# Patient Record
Sex: Female | Born: 1980 | Race: Black or African American | Hispanic: No | Marital: Married | Smoking: Current every day smoker
Health system: Southern US, Community
[De-identification: ages and names within clinical notes are randomized; demographics above are authoritative.]

---

## 2017-06-20 ENCOUNTER — Emergency Department
Admission: EM | Admit: 2017-06-20 | Discharge: 2017-06-20 | Disposition: A | Discharge status: Home / Self Care | Payer: Self-pay | Attending: Emergency Medicine | Admitting: Emergency Medicine

## 2017-06-20 ENCOUNTER — Encounter: Payer: Self-pay | Admitting: *Deleted

## 2017-06-20 ENCOUNTER — Emergency Department: Payer: Self-pay

## 2017-06-20 DIAGNOSIS — F17210 Nicotine dependence, cigarettes, uncomplicated: Secondary | ICD-10-CM | POA: Insufficient documentation

## 2017-06-20 DIAGNOSIS — N10 Acute tubulo-interstitial nephritis: Secondary | ICD-10-CM | POA: Insufficient documentation

## 2017-06-20 LAB — BASIC METABOLIC PANEL
ANION GAP: 7 (ref 5–15)
BUN: 10 mg/dL (ref 6–20)
CALCIUM: 9.3 mg/dL (ref 8.9–10.3)
CO2: 25 mmol/L (ref 22–32)
Chloride: 106 mmol/L (ref 101–111)
Creatinine, Ser: 0.74 mg/dL (ref 0.44–1.00)
GFR calc non Af Amer: >60 mL/min (ref >60)
Glucose, Bld: 130 mg/dL — ABNORMAL HIGH (ref 65–99)
Potassium: 3.8 mmol/L (ref 3.5–5.1)
SODIUM: 138 mmol/L (ref 135–145)

## 2017-06-20 LAB — URINALYSIS, COMPLETE (UACMP) WITH MICROSCOPIC
BILIRUBIN URINE: NEGATIVE
GLUCOSE, UA: NEGATIVE mg/dL
HGB URINE DIPSTICK: NEGATIVE
KETONES UR: NEGATIVE mg/dL
Nitrite: NEGATIVE
Protein, ur: NEGATIVE mg/dL
Specific Gravity, Urine: 1.018 (ref 1.005–1.030)
pH: 6 (ref 5.0–8.0)

## 2017-06-20 LAB — CBC
HCT: 36.4 % (ref 35.0–47.0)
HEMOGLOBIN: 12.7 g/dL (ref 12.0–16.0)
MCH: 33.1 pg (ref 26.0–34.0)
MCHC: 34.9 g/dL (ref 32.0–36.0)
MCV: 94.7 fL (ref 80.0–100.0)
PLATELETS: 234 10*3/uL (ref 150–440)
RBC: 3.84 MIL/uL (ref 3.80–5.20)
RDW: 12.5 % (ref 11.5–14.5)
WBC: 7.7 10*3/uL (ref 3.6–11.0)

## 2017-06-20 LAB — PREGNANCY, URINE: Preg Test, Ur: NEGATIVE

## 2017-06-20 MED ORDER — ONDANSETRON 4 MG PO TBDP: 4.0000 mg | ORAL_TABLET | Freq: Three times a day (TID) | ORAL | 0 refills | Status: AC | PRN

## 2017-06-20 MED ORDER — CIPROFLOXACIN HCL 500 MG PO TABS
500.0000 mg | ORAL_TABLET | Freq: Once | ORAL | Status: AC
  Administered 2017-06-20: 500 mg via ORAL
  Filled 2017-06-20: qty 1

## 2017-06-20 MED ORDER — ONDANSETRON HCL 4 MG/2ML IJ SOLN
4.0000 mg | Freq: Once | INTRAMUSCULAR | Status: AC
  Administered 2017-06-20: 4 mg via INTRAVENOUS
  Filled 2017-06-20: qty 2

## 2017-06-20 MED ORDER — KETOROLAC TROMETHAMINE 30 MG/ML IJ SOLN
30.0000 mg | Freq: Once | INTRAMUSCULAR | Status: AC
  Administered 2017-06-20: 30 mg via INTRAVENOUS
  Filled 2017-06-20: qty 1

## 2017-06-20 MED ORDER — CIPROFLOXACIN HCL 500 MG PO TABS: 500.0000 mg | ORAL_TABLET | Freq: Two times a day (BID) | ORAL | 0 refills | Status: AC

## 2017-06-20 MED ORDER — OXYCODONE-ACETAMINOPHEN 5-325 MG PO TABS: 1.0000 | ORAL_TABLET | ORAL | 0 refills | Status: AC | PRN

## 2017-06-20 NOTE — ED Notes (Signed)

## 2017-06-20 NOTE — ED Notes (Signed)
Pt going to CT

## 2017-06-20 NOTE — ED Provider Notes (Signed)
[facility] Emergency Department Provider Note    First MD Initiated Contact with Patient 06/20/17 0230     (approximate)  I have reviewed the triage vital signs and the nursing notes.   HISTORY  Chief Complaint Flank Pain    HPI Lauren Conley is a 36 y.o. female presents to the emergency department with one-week history of right flank pain nausea and dysuria. Patient denies any history of kidney stones. Patient does admit to malodorous urine. Patient denies any fever. Patient denies any vomiting   Past medical history None There are no active problems to display for this patient.   Past surgical history None  Prior to Admission medications   Medication Sig Start Date End Date Taking? Authorizing Provider  ciprofloxacin (CIPRO) 500 MG tablet Take 1 tablet (500 mg total) by mouth 2 (two) times daily. 06/20/17 06/30/17  Darci Current, MD  ondansetron (ZOFRAN ODT) 4 MG disintegrating tablet Take 1 tablet (4 mg total) by mouth every 8 (eight) hours as needed for nausea or vomiting. 06/20/17   Darci Current, MD  oxyCODONE-acetaminophen (ROXICET) 5-325 MG tablet Take 1 tablet by mouth every 4 (four) hours as needed for severe pain. 06/20/17   Darci Current, MD    Allergies No known drug allergies  No family history on file.  Social History Social History  Substance Use Topics  . Smoking status: Current Every Day Smoker  . Smokeless tobacco: Never Used  . Alcohol use No    Review of Systems Constitutional: No fever/chills Eyes: No visual changes. ENT: No sore throat. Cardiovascular: Denies chest pain. Respiratory: Denies shortness of breath. Gastrointestinal: Positive for right flank pain  No nausea, no vomiting.  No diarrhea.  No constipation. Genitourinary: Positive for dysuria and malodorous urine Musculoskeletal: Negative for neck pain.  Negative for back pain. Integumentary: Negative for rash. Neurological: Negative for  headaches, focal weakness or numbness.   ____________________________________________   PHYSICAL EXAM:  VITAL SIGNS: ED Triage Vitals  Enc Vitals Group     BP 06/20/17 0016 117/79     Pulse Rate 06/20/17 0016 82     Resp 06/20/17 0016 20     Temp 06/20/17 0016 98.3 F (36.8 C)     Temp Source 06/20/17 0016 Oral     SpO2 06/20/17 0016 100 %     Weight 06/20/17 0016 59 kg (130 lb)     Height 06/20/17 0016 1.575 m (5\' 2" )     Head Circumference --      Peak Flow --      Pain Score 06/20/17 0015 10     Pain Loc --      Pain Edu? --      Excl. in GC? --     Constitutional: Alert and oriented. Well appearing and in no acute distress. Eyes: Conjunctivae are normal. PERRL. EOMI. Head: Atraumatic. Mouth/Throat: Mucous membranes are moist.  Oropharynx non-erythematous. Neck: No stridor.   Cardiovascular: Normal rate, regular rhythm. Good peripheral circulation. Grossly normal heart sounds. Respiratory: Normal respiratory effort.  No retractions. Lungs CTAB. Gastrointestinal: Soft and nontender. No distention. Genitourinary : Right CVA tenderness Musculoskeletal: No lower extremity tenderness nor edema. No gross deformities of extremities. Neurologic:  Normal speech and language. No gross focal neurologic deficits are appreciated.  Skin:  Skin is warm, dry and intact. No rash noted. Psychiatric: Mood and affect are normal. Speech and behavior are normal.  ____________________________________________   LABS (all labs ordered are listed, but only  abnormal results are displayed)  Labs Reviewed  BASIC METABOLIC PANEL - Abnormal; Notable for the following:       Result Value   Glucose, Bld 130 (*)    All other components within normal limits  URINALYSIS, COMPLETE (UACMP) WITH MICROSCOPIC - Abnormal; Notable for the following:    Color, Urine YELLOW (*)    APPearance HAZY (*)    Leukocytes, UA SMALL (*)    Bacteria, UA RARE (*)    Squamous Epithelial / LPF 0-5 (*)    All  other components within normal limits  URINE CULTURE  CBC  PREGNANCY, URINE  POC URINE PREG, ED    RADIOLOGY I, Sheldon N BROWN, personally viewed and evaluated these images (plain radiographs) as part of my medical decision making, as well as reviewing the written report by the radiologist.  Ct Renal Stone Study  Result Date: 06/20/2017 CLINICAL DATA:  Right flank pain for 3 days.  Nausea.  Dysuria. EXAM: CT ABDOMEN AND PELVIS WITHOUT CONTRAST TECHNIQUE: Multidetector CT imaging of the abdomen and pelvis was performed following the standard protocol without IV contrast. COMPARISON:  None. FINDINGS: Lower chest: Lung bases are clear. No pleural fluid or consolidation. Hepatobiliary: 7 mm cyst in the left hepatic lobe. No additional hepatic lesion. Gallbladder is minimally distended, no calcified stone. No biliary dilatation. Pancreas: No ductal dilatation or inflammation. Spleen: Normal in size without focal abnormality. Adrenals/Urinary Tract: No adrenal nodule. No renal stones or hydronephrosis. No perinephric edema. Urinary bladder is minimally distended without wall thickening or stone. Stomach/Bowel: Suboptimal bowel assessment given lack of contrast and paucity of intra-abdominal fat. Allowing for this, no evidence of bowel obstruction or inflammation. Stomach is decompressed. Moderate colonic stool burden. The appendix is not confidently visualized. No pericecal inflammation. There are surgical clips adjacent to the cecum and in the left lower quadrant. Vascular/Lymphatic: Normal caliber abdominal aorta, trace atherosclerosis. Decreased sensitivity for detection of adenopathy given lack contrast and paucity of intra-abdominal fat. No bulky adenopathy is seen. Reproductive: There is a 2.8 cm left adnexal cyst. Right ovary is not well-defined. Unremarkable CT appearance of the uterus. Other: No free air, free fluid, or intra-abdominal fluid collection. Laxity of the anterior abdominal wall. Multiple  pelvic phleboliths. Musculoskeletal: There are no acute or suspicious osseous abnormalities. IMPRESSION: 1. No renal stones or obstructive uropathy. 2. No evidence of acute abnormality in the abdomen/pelvis to explain right-sided pain. A 2.8 cm left adnexal cyst is likely incidental. Electronically Signed   By: Rubye OaksMelanie  Ehinger M.D.   On: 06/20/2017 03:20    ______  Procedures   ____________________________________________   INITIAL IMPRESSION / ASSESSMENT AND PLAN / ED COURSE  Pertinent labs & imaging results that were available during my care of the patient were reviewed by me and considered in my medical decision making (see chart for details).  36 year old female presenting to the emergency department history and physical exam consistent with acute pyelonephritis. Urinalysis consistent with UTI. Patient given Toradol emergency department will be prescribed Cipro and Percocet for home      ____________________________________________  FINAL CLINICAL IMPRESSION(S) / ED DIAGNOSES  Final diagnoses:  Pyelonephritis, acute     MEDICATIONS GIVEN DURING THIS VISIT:  Medications  ondansetron (ZOFRAN) injection 4 mg (4 mg Intravenous Given 06/20/17 0311)  ketorolac (TORADOL) 30 MG/ML injection 30 mg (30 mg Intravenous Given 06/20/17 0311)  ciprofloxacin (CIPRO) tablet 500 mg (500 mg Oral Given 06/20/17 0348)     NEW OUTPATIENT MEDICATIONS STARTED DURING THIS VISIT:  New Prescriptions   CIPROFLOXACIN (CIPRO) 500 MG TABLET    Take 1 tablet (500 mg total) by mouth 2 (two) times daily.   ONDANSETRON (ZOFRAN ODT) 4 MG DISINTEGRATING TABLET    Take 1 tablet (4 mg total) by mouth every 8 (eight) hours as needed for nausea or vomiting.   OXYCODONE-ACETAMINOPHEN (ROXICET) 5-325 MG TABLET    Take 1 tablet by mouth every 4 (four) hours as needed for severe pain.    Modified Medications   No medications on file    Discontinued Medications   No medications on file     Note:  This  document was prepared using Dragon voice recognition software and may include unintentional dictation errors.    Darci Current, MD 06/20/17 [phone]

## 2017-06-20 NOTE — ED Triage Notes (Signed)
Pt has right flank pain for 3-4 days  Pt has nausea and dysuria.  No hx kidney stones.  Pt reports foul odor urine. Pt alert.

## 2017-06-22 LAB — URINE CULTURE

## 2017-11-16 IMAGING — CT CT RENAL STONE PROTOCOL
3 of 4 series · 9 of 46 positions shown, 16 images · non-contrast
Comparison: None.

CLINICAL DATA: Right flank pain for 3 days.  Nausea.  Dysuria.

EXAM:
CT ABDOMEN AND PELVIS WITHOUT CONTRAST
TECHNIQUE: Multidetector CT imaging of the abdomen and pelvis was performed
following the standard protocol without IV contrast.

[Series 4: lung bases · axial · 0.62mm/px · z∈[-810,-740]mm · 5 of 22 slices shown, 10 images]
[im 4/22  soft-tissue]
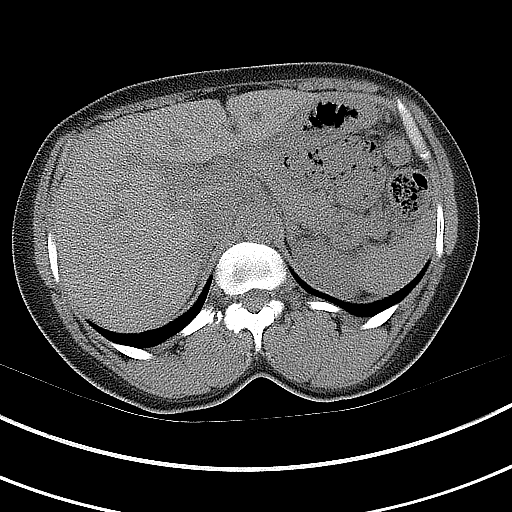
[im 4/22  bone]
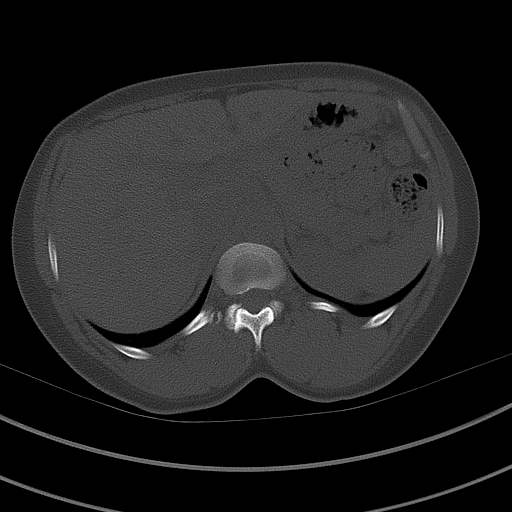
[im 8/22  soft-tissue]
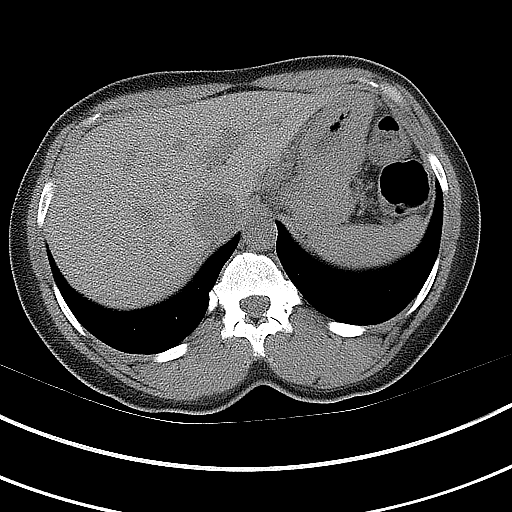
[im 8/22  lung]
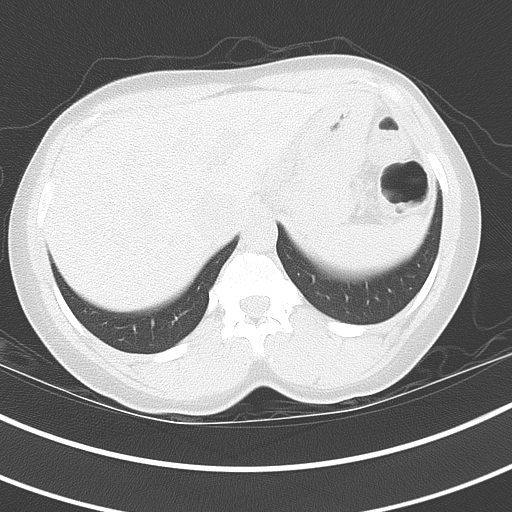
[im 11/22  soft-tissue]
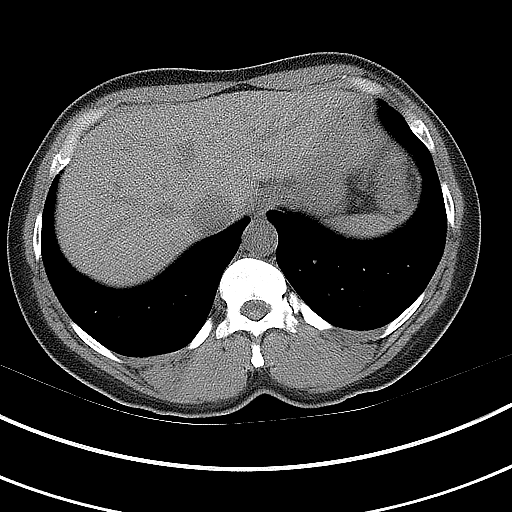
[im 11/22  lung]
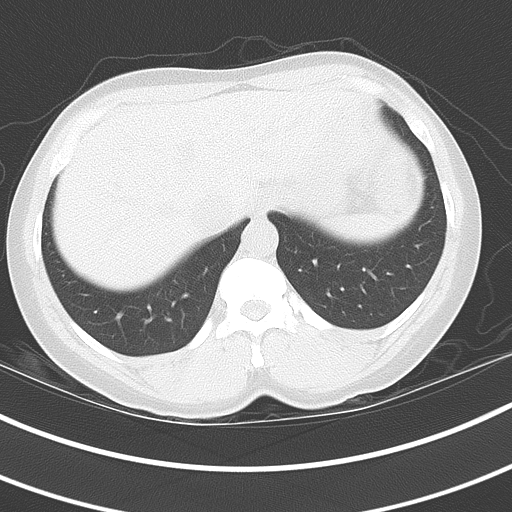
[im 15/22  soft-tissue]
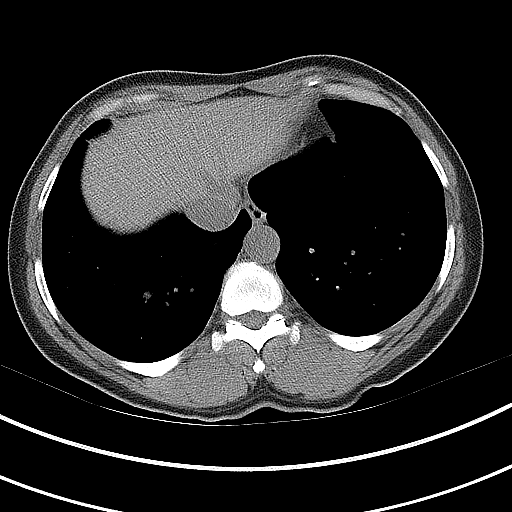
[im 15/22  lung]
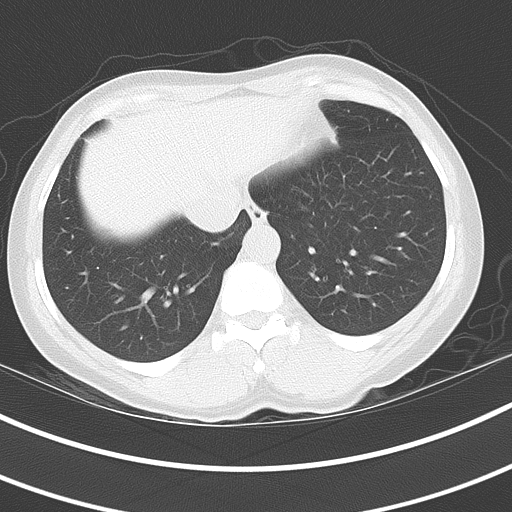
[im 18/22  soft-tissue]
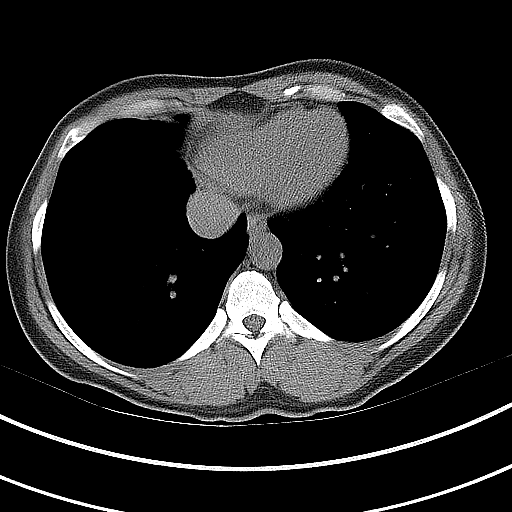
[im 18/22  lung]
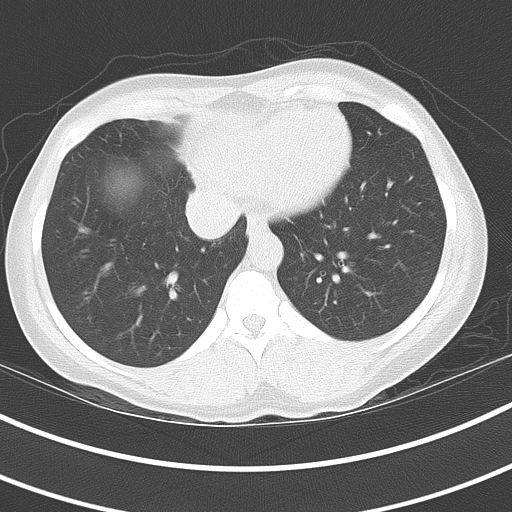

[Series 5: coronal · coronal · 0.69mm/px · 3 of 118 slices shown, 4 images]
[im 40/118  soft-tissue]
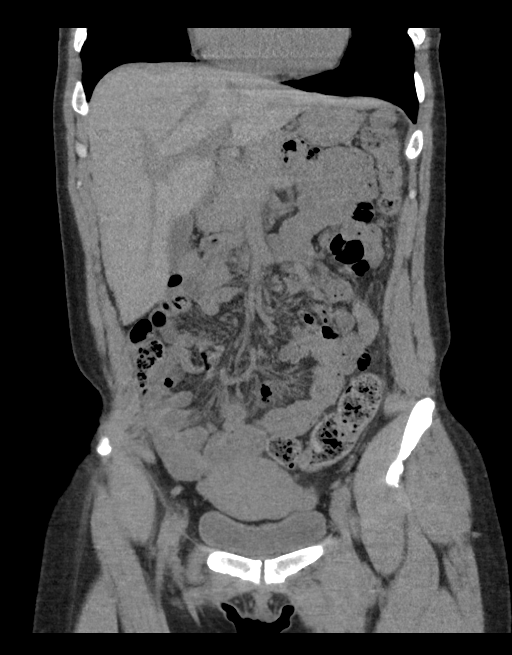
[im 53/118  soft-tissue]
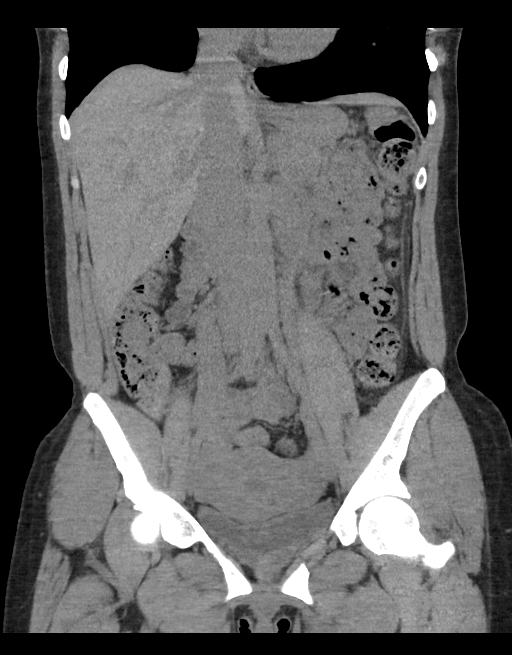
[im 53/118  bone]
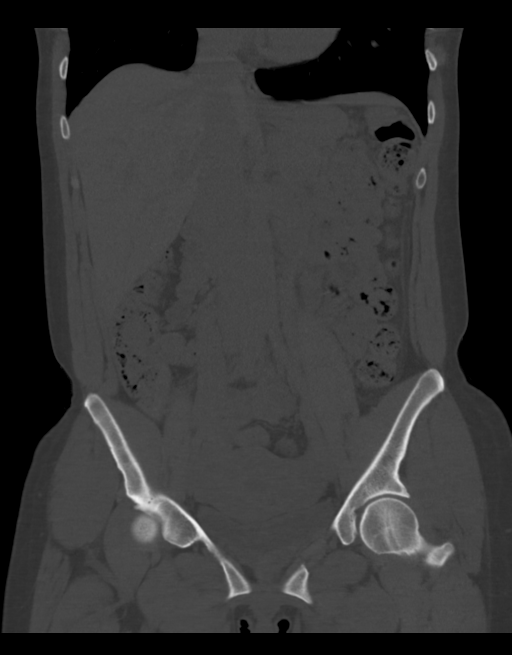
[im 66/118  soft-tissue]
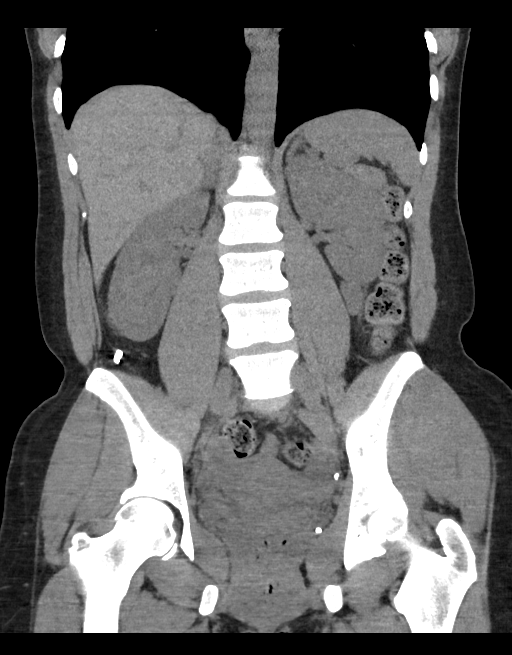

[Series 6: sagittal · sagittal · 0.58mm/px · 1 of 153 slices shown, 2 images]
[im 51/153  soft-tissue]
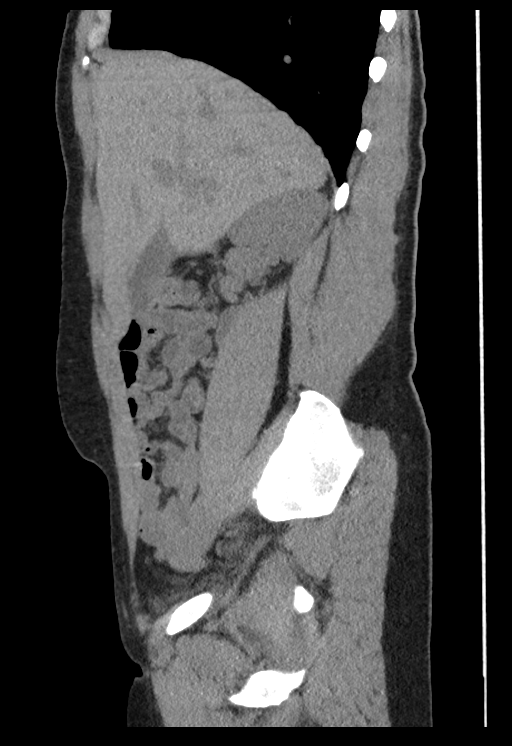
[im 51/153  bone]
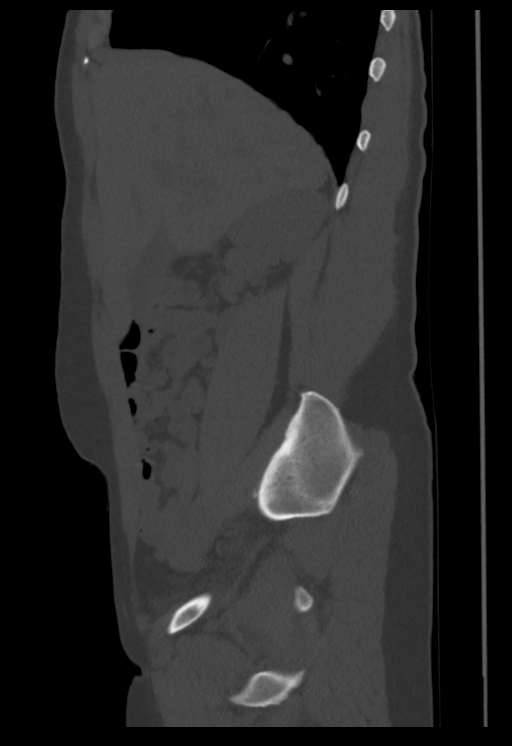

[9 of 46 positions shown; findings below may reference images not displayed]

FINDINGS: Lower chest: Lung bases are clear. No pleural fluid or
consolidation.

Hepatobiliary: 7 mm cyst in the left hepatic lobe. No additional
hepatic lesion. Gallbladder is minimally distended, no calcified
stone. No biliary dilatation.

Pancreas: No ductal dilatation or inflammation.

Spleen: Normal in size without focal abnormality.

Adrenals/Urinary Tract: No adrenal nodule. No renal stones or
hydronephrosis. No perinephric edema. Urinary bladder is minimally
distended without wall thickening or stone.

Stomach/Bowel: Suboptimal bowel assessment given lack of contrast
and paucity of intra-abdominal fat. Allowing for this, no evidence
of bowel obstruction or inflammation. Stomach is decompressed.
Moderate colonic stool burden. The appendix is not confidently
visualized. No pericecal inflammation. There are surgical clips
adjacent to the cecum and in the left lower quadrant.

Vascular/Lymphatic: Normal caliber abdominal aorta, trace
atherosclerosis. Decreased sensitivity for detection of adenopathy
given lack contrast and paucity of intra-abdominal fat. No bulky
adenopathy is seen.

Reproductive: There is a 2.8 cm left adnexal cyst. Right ovary is
not well-defined. Unremarkable CT appearance of the uterus.

Other: No free air, free fluid, or intra-abdominal fluid collection.
Laxity of the anterior abdominal wall. Multiple pelvic phleboliths.

Musculoskeletal: There are no acute or suspicious osseous
abnormalities.
IMPRESSION: 1. No renal stones or obstructive uropathy.
2. No evidence of acute abnormality in the abdomen/pelvis to explain
right-sided pain. A 2.8 cm left adnexal cyst is likely incidental.
